# Patient Record
Sex: Male | Born: 2005 | Race: Black or African American | Hispanic: No | Marital: Single | Smoking: Never smoker
Health system: Southern US, Community
[De-identification: ages and names within clinical notes are randomized; demographics above are authoritative.]

## PROBLEM LIST (undated history)

## (undated) DIAGNOSIS — L309 Dermatitis, unspecified: Secondary | ICD-10-CM

## (undated) DIAGNOSIS — J45909 Unspecified asthma, uncomplicated: Secondary | ICD-10-CM

## (undated) DIAGNOSIS — T7840XA Allergy, unspecified, initial encounter: Secondary | ICD-10-CM

## (undated) HISTORY — DX: Unspecified asthma, uncomplicated: J45.909

## (undated) HISTORY — DX: Allergy, unspecified, initial encounter: T78.40XA

## (undated) HISTORY — DX: Dermatitis, unspecified: L30.9

---
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
## 2024-02-06 ENCOUNTER — Telehealth: Payer: Self-pay | Admitting: Internal Medicine

## 2024-02-06 NOTE — Telephone Encounter (Signed)
 Copied from CRM [id number]. Topic: General - Other >> Feb 06, 2024 12:48 PM Denese Killings wrote: Reason for CRM: Patient mom states that [facility] won't accept the mailed copy of immunization records since it doesn't have the doctor's stamp on it. She wants to know if a copy with doctor's stamp can be emailed or if she has to pick it up the copy with doctor's stamp on it.  Email: [email or web address]

## 2024-02-06 NOTE — Telephone Encounter (Signed)
 Mom picked up form.

## 2024-02-06 NOTE — Telephone Encounter (Signed)
 Spoke to pt's mom. [facility] copy with office stamp is ready. They will come pick it up.

## 2024-03-14 ENCOUNTER — Other Ambulatory Visit: Payer: Self-pay

## 2024-03-14 ENCOUNTER — Encounter: Payer: Self-pay | Admitting: Family Medicine

## 2024-03-14 ENCOUNTER — Ambulatory Visit: Payer: 59 | Admitting: Family Medicine

## 2024-03-14 VITALS — BP 100/64 | HR 78 | Temp 98.0°F | Ht 62.21 in | Wt 111.1 lb

## 2024-03-14 DIAGNOSIS — T7800XD Anaphylactic reaction due to unspecified food, subsequent encounter: Secondary | ICD-10-CM | POA: Diagnosis not present

## 2024-03-14 DIAGNOSIS — J3089 Other allergic rhinitis: Secondary | ICD-10-CM

## 2024-03-14 DIAGNOSIS — J302 Other seasonal allergic rhinitis: Secondary | ICD-10-CM

## 2024-03-14 DIAGNOSIS — J4520 Mild intermittent asthma, uncomplicated: Secondary | ICD-10-CM

## 2024-03-14 DIAGNOSIS — T7800XA Anaphylactic reaction due to unspecified food, initial encounter: Secondary | ICD-10-CM

## 2024-03-14 DIAGNOSIS — L2084 Intrinsic (allergic) eczema: Secondary | ICD-10-CM | POA: Diagnosis not present

## 2024-03-14 MED ORDER — EPINEPHRINE 0.3 MG/0.3ML IJ SOAJ: 0.3000 mg | INTRAMUSCULAR | 2 refills | Status: AC | PRN

## 2024-03-14 MED ORDER — MONTELUKAST SODIUM 10 MG PO TABS: 10.0000 mg | ORAL_TABLET | Freq: Every day | ORAL | 5 refills | Status: DC

## 2024-03-14 MED ORDER — ALBUTEROL SULFATE HFA 108 (90 BASE) MCG/ACT IN AERS: 2.0000 | INHALATION_SPRAY | RESPIRATORY_TRACT | 1 refills | Status: DC | PRN

## 2024-03-14 NOTE — Patient Instructions (Signed)
 Asthma Restart montelukast  10 mg once a day to prevent cough or wheeze Continue albuterol  2 puffs once every 4 hours as needed for cough or wheeze You may use albuterol  2 puffs 5-15 minutes before activity to decrease cough or wheeze  Allergic rhinitis Continue allergen avoidance directed toward grass pollen, weed pollen, tree pollen, mold and dust mite as listed below Restart montelukast  as listed above to help decrease allergy  symptoms You may take an over the counter antihistamine once a day if needed for a runny nose or itch Consider saline nasal rinses as needed for nasal symptoms. Use this before any medicated nasal sprays for best result  Atopic dermatitis Continue a twice a day moisturizing routine  Food allergy  Continue to avoid peanuts, tree nuts, coconut, sesame, and egg.  In case of an allergic reaction, give Benadryl  50 mg every 6 hours, and if life-threatening symptoms occur, inject with EpiPen  0.3 mg.  Call the clinic if this treatment plan is not working well for you  Follow up in 6 months or sooner if needed.  Reducing Pollen Exposure The American Academy of Allergy , Asthma and Immunology suggests the following steps to reduce your exposure to pollen during allergy  seasons. Do not hang sheets or clothing out to dry; pollen may collect on these items. Do not mow lawns or spend time around freshly cut grass; mowing stirs up pollen. Keep windows closed at night.  Keep car windows closed while driving. Minimize morning activities outdoors, a time when pollen counts are usually at their highest. Stay indoors as much as possible when pollen counts or humidity is high and on windy days when pollen tends to remain in the air longer. Use air conditioning when possible.  Many air conditioners have filters that trap the pollen spores. Use a HEPA room air filter to remove pollen form the indoor air you breathe.  Control of Mold Allergen Mold and fungi can grow on a variety of  surfaces provided certain temperature and moisture conditions exist.  Outdoor molds grow on plants, decaying vegetation and soil.  The major outdoor mold, Alternaria and Cladosporium, are found in very high numbers during hot and dry conditions.  Generally, a late Summer - Fall peak is seen for common outdoor fungal spores.  Rain will temporarily lower outdoor mold spore count, but counts rise rapidly when the rainy period ends.  The most important indoor molds are Aspergillus and Penicillium.  Dark, humid and poorly ventilated basements are ideal sites for mold growth.  The next most common sites of mold growth are the bathroom and the kitchen.  Outdoor [employer] Use air conditioning and keep windows closed Avoid exposure to decaying vegetation. Avoid leaf raking. Avoid grain handling. Consider wearing a face mask if working in moldy areas.  Indoor Mold Control Maintain humidity below 50%. Clean washable surfaces with 5% bleach solution. Remove sources e.g. Contaminated carpets.   Control of Dust Mite Allergen Dust mites play a major role in allergic asthma and rhinitis. They occur in environments with high humidity wherever human skin is found. Dust mites absorb humidity from the atmosphere (ie, they do not drink) and feed on organic matter (including shed human and animal skin). Dust mites are a microscopic type of insect that you cannot see with the naked eye. High levels of dust mites have been detected from mattresses, pillows, carpets, upholstered furniture, bed covers, clothes, soft toys and any woven material. The principal allergen of the dust mite is found in its feces. A  gram of dust may contain 1,000 mites and 250,000 fecal particles. Mite antigen is easily measured in the air during house cleaning activities. Dust mites do not bite and do not cause harm to humans, other than by triggering allergies/asthma.  Ways to decrease your exposure to dust mites in your home:  1. Encase  mattresses, box springs and pillows with a mite-impermeable barrier or cover  2. Wash sheets, blankets and drapes weekly in hot water (130 F) with detergent and dry them in a dryer on the hot setting.  3. Have the room cleaned frequently with a vacuum cleaner and a damp dust-mop. For carpeting or rugs, vacuuming with a vacuum cleaner equipped with a high-efficiency particulate air (HEPA) filter. The dust mite allergic individual should not be in a room which is being cleaned and should wait 1 hour after cleaning before going into the room.  4. Do not sleep on upholstered furniture (eg, couches).  5. If possible removing carpeting, upholstered furniture and drapery from the home is ideal. Horizontal blinds should be eliminated in the rooms where the person spends the most time (bedroom, study, television room). Washable vinyl, roller-type shades are optimal.  6. Remove all non-washable stuffed toys from the bedroom. Wash stuffed toys weekly like sheets and blankets above.  7. Reduce indoor humidity to less than 50%. Inexpensive humidity monitors can be purchased at most hardware stores. Do not use a humidifier as can make the problem worse and are not recommended.   Skin care recommendations   Bath time: Always use lukewarm water. AVOID very hot or cold water. Keep bathing time to 5-10 minutes. Do NOT use bubble bath. Use a mild soap and use just enough to wash the dirty areas. Do NOT scrub skin vigorously.  After bathing, pat dry your skin with a towel. Do NOT rub or scrub the skin.   Moisturizers and prescriptions:  ALWAYS apply moisturizers immediately after bathing (within 3 minutes). This helps to lock-in moisture. Use the moisturizer several times a day over the whole body. Good summer moisturizers include: Aveeno, CeraVe, Cetaphil. Good winter moisturizers include: Aquaphor, Vaseline, Cerave, Cetaphil, Eucerin, Vanicream. When using moisturizers along with medications, the  moisturizer should be applied about one hour after applying the medication to prevent diluting effect of the medication or moisturize around where you applied the medications. When not using medications, the moisturizer can be continued twice daily as maintenance.   Laundry and clothing: Avoid laundry products with added color or perfumes. Use unscented hypo-allergenic laundry products such as Tide free, Cheer free & gentle, and All free and clear.  If the skin still seems dry or sensitive, you can try double-rinsing the clothes. Avoid tight or scratchy clothing such as wool. Do not use fabric softeners or dyer sheets.

## 2024-03-14 NOTE — Progress Notes (Signed)
 [address] [place] [address] Dept: [phone]  FOLLOW UP NOTE  Patient ID: Justin Steele, male    DOB: 03/20/2006  Age: 19 y.o. MRN: [id number] Date of Office Visit: 03/14/2024  Assessment  Chief Complaint: Eczema, Asthma, Allergic Rhinitis , and Follow-up  HPI Justin Steele is an 18 year old male who presents to the clinic for follow-up visit.  He was last seen in this clinic on 09/14/2019 for by Marinus Sic, FNP, for evaluation of asthma, allergic rhinitis, atopic dermatitis, and food allergy  to peanuts, tree nuts, coconut, sesame, and egg.  He is accompanied by his mother who assists with history.  At today's visit, he reports that his asthma has been moderately well-controlled with occasional shortness of breath that occurs at random times.  He continues albuterol  about 1 to 2 days a week and is not currently taking montelukast .  He reports that he forgets to take montelukast .  Allergic rhinitis is reported as well-controlled with no nasal symptoms at this time including rhinorrhea, nasal congestion, sneezing, or postnasal drainage.  He is not currently using Flonase or nasal saline rinses.  He reports occasionally taking Allegra as needed with relief of symptoms. Chart review indicates that he began allergen immunotherapy directed toward weed pollen, dust mite, mold, grass pollen, and tree pollen in 2014.  His last allergen immunotherapy injection was on 09/14/2023.  Atopic dermatitis is reported as moderately well-controlled with only occasional itch.  He is not currently using a daily moisturizer and reports that atopic dermatitis is not bothersome at this time.  He continues to avoid peanuts, tree nuts, coconut, egg, and sesame with no accidental ingestion or EpiPen  use since his last visit.  His last food allergy  testing via lab was on 10/12/2022 and was positive to peanuts, tree nuts, coconut, sesame, and egg.  EpiPen  sent will be reordered at today's  visit.  His current medications are listed in the chart.   Drug Allergies:  Allergies  Allergen Reactions   Peanuts [Peanut  Oil] Anaphylaxis   Egg-Derived Products     Per allergy  testing- can eat egg derivatives     Physical Exam: BP 100/64 (BP Location: Left Arm, Patient Position: Sitting, Cuff Size: Normal)   Pulse 78   Temp 98 F (36.7 C) (Temporal)   Ht 5' 2.21" (1.58 m)   Wt 111 lb 1.6 oz (50.4 kg)   SpO2 97%   BMI 20.19 kg/m    Physical Exam Vitals reviewed.  Constitutional:      Appearance: Normal appearance.  HENT:     Head: Normocephalic and atraumatic.     Right Ear: Tympanic membrane normal.     Left Ear: Tympanic membrane normal.     Nose:     Comments: Bilateral nares slightly erythematous with thin clear nasal drainage noted.  Pharynx normal.  Ears normal.  Eyes normal.    Mouth/Throat:     Pharynx: Oropharynx is clear.  Eyes:     Conjunctiva/sclera: Conjunctivae normal.  Cardiovascular:     Rate and Rhythm: Normal rate and regular rhythm.     Heart sounds: Normal heart sounds. No murmur heard. Pulmonary:     Effort: Pulmonary effort is normal.     Breath sounds: Normal breath sounds.     Comments: Lungs clear to auscultation Musculoskeletal:        General: Normal range of motion.     Cervical back: Normal range of motion and neck supple.  Skin:    General: Skin is  warm and dry.  Neurological:     Mental Status: He is alert and oriented to person, place, and time.  Psychiatric:        Mood and Affect: Mood normal.        Behavior: Behavior normal.        Thought Content: Thought content normal.        Judgment: Judgment normal.     Diagnostics: FVC 3.10 which is 95% of predicted value, FEV1 2.52 which is 87% of predicted value.  Spirometry indicates normal ventilatory function.  Assessment and Plan: 1. Mild intermittent asthma without complication   2. Seasonal and perennial allergic rhinitis   3. Intrinsic atopic dermatitis   4.  Allergy  with anaphylaxis due to food     Meds ordered this encounter  Medications   albuterol  (VENTOLIN  HFA) 108 (90 Base) MCG/ACT inhaler    Sig: Inhale 2 puffs into the lungs every 4 (four) hours as needed for wheezing or shortness of breath.    Dispense:  6.7 each    Refill:  1   EPINEPHrine  (EPIPEN  2-PAK) 0.3 mg/0.3 mL IJ SOAJ injection    Sig: Inject 0.3 mg into the muscle as needed for anaphylaxis.    Dispense:  1 each    Refill:  2   montelukast  (SINGULAIR ) 10 MG tablet    Sig: Take 1 tablet (10 mg total) by mouth at bedtime.    Dispense:  30 tablet    Refill:  5    Patient Instructions  Asthma Restart montelukast  10 mg once a day to prevent cough or wheeze Continue albuterol  2 puffs once every 4 hours as needed for cough or wheeze You may use albuterol  2 puffs 5-15 minutes before activity to decrease cough or wheeze  Allergic rhinitis Continue allergen avoidance directed toward grass pollen, weed pollen, tree pollen, mold and dust mite as listed below Restart montelukast  as listed above to help decrease allergy  symptoms You may take an over the counter antihistamine once a day if needed for a runny nose or itch Consider saline nasal rinses as needed for nasal symptoms. Use this before any medicated nasal sprays for best result  Atopic dermatitis Continue a twice a day moisturizing routine  Food allergy  Continue to avoid peanuts, tree nuts, coconut, sesame, and egg.  In case of an allergic reaction, give Benadryl  50 mg every 6 hours, and if life-threatening symptoms occur, inject with EpiPen  0.3 mg.  Call the clinic if this treatment plan is not working well for you  Follow up in 6 months or sooner if needed.   Return in about 6 months (around 09/13/2024), or if symptoms worsen or fail to improve.    Thank you for the opportunity to care for this patient.  Please do not hesitate to contact me with questions.  Marinus Sic, FNP [facility]

## 2024-03-19 ENCOUNTER — Ambulatory Visit
Admission: EM | Admit: 2024-03-19 | Discharge: 2024-03-19 | Disposition: A | Discharge status: Home / Self Care | Attending: Family Medicine | Admitting: Family Medicine

## 2024-03-19 DIAGNOSIS — L299 Pruritus, unspecified: Secondary | ICD-10-CM | POA: Diagnosis not present

## 2024-03-19 DIAGNOSIS — R21 Rash and other nonspecific skin eruption: Secondary | ICD-10-CM

## 2024-03-19 MED ORDER — DOXYCYCLINE HYCLATE 100 MG PO CAPS: 100.0000 mg | ORAL_CAPSULE | Freq: Two times a day (BID) | ORAL | 0 refills | Status: AC

## 2024-03-19 MED ORDER — METHYLPREDNISOLONE 4 MG PO TBPK: ORAL_TABLET | ORAL | 0 refills | Status: DC

## 2024-03-19 NOTE — ED Triage Notes (Signed)
 Pt presents to [facility] itchy scalp since getting his hair braded last week. Pt is with mother who reports she took the braids out and washed it but he has still been co of extream itchiness.

## 2024-03-19 NOTE — ED Provider Notes (Signed)
 [facility]    CSN: [id number] Arrival date & time: 03/19/24  1509      History   Chief Complaint Chief Complaint  Patient presents with   scalp itchiness     HPI Justin Steele is a 18 y.o. male.   HPI 19 year old male presents with scalp itching for days.  PMH significant for eczema/atopic dermatitis.  Past Medical History:  Diagnosis Date   Allergy     allergic rhinitis   Asthma with allergic rhinitis    Eczema     Patient Active Problem List   Diagnosis Date Noted   Seasonal and perennial allergic rhinitis 06/09/2016   Food allergy  06/09/2016   Well adolescent visit without abnormal findings 02/17/2011   Mild intermittent asthma 01/16/2009   Atopic dermatitis 01/20/2007    History reviewed. No pertinent surgical history.     Home Medications    Prior to Admission medications   Medication Sig Start Date End Date Taking? Authorizing Provider  doxycycline (VIBRAMYCIN) 100 MG capsule Take 1 capsule (100 mg total) by mouth 2 (two) times daily for 7 days. 03/19/24 03/26/24 Yes Leonides Ramp, FNP  methylPREDNISolone (MEDROL DOSEPAK) 4 MG TBPK tablet Take as directed 03/19/24  Yes Leonides Ramp, FNP  albuterol  (VENTOLIN  HFA) 108 (90 Base) MCG/ACT inhaler Inhale 2 puffs into the lungs every 4 (four) hours as needed for wheezing or shortness of breath. 03/14/24   Ardie Kras, FNP  diphenhydrAMINE  (BENADRYL ) 12.5 MG/5ML elixir Take by mouth every 6 (six) hours as needed.    [provider]  EPINEPHrine  (EPIPEN  2-PAK) 0.3 mg/0.3 mL IJ SOAJ injection Inject 0.3 mg into the muscle as needed for anaphylaxis. 03/14/24   Ardie Kras, FNP  montelukast  (SINGULAIR ) 10 MG tablet Take 1 tablet (10 mg total) by mouth at bedtime. 03/14/24   Ambs, Jeanmarie Millet, FNP  NON FORMULARY once a week. Allergy  shots Patient not taking: Reported on 03/14/2024    [provider]    Family History Family History  Problem Relation Age of Onset   Allergies Mother     Hypertension Other        both sides   Allergic rhinitis Neg Hx    Angioedema Neg Hx    Asthma Neg Hx    Eczema Neg Hx    Immunodeficiency Neg Hx    Urticaria Neg Hx     Social History Social History   Tobacco Use   Smoking status: Never    Passive exposure: Current   Smokeless tobacco: Never  Vaping Use   Vaping status: Never Used  Substance Use Topics   Alcohol use: No   Drug use: No     Allergies   Peanuts [peanut  oil], Egg-derived products, Justicia adhatoda (malabar nut tree) [justicia adhatoda], Sesame seed (diagnostic), and Walnut   Review of Systems Review of Systems   Physical Exam Triage Vital Signs ED Triage Vitals  Encounter Vitals Group     BP      Systolic BP Percentile      Diastolic BP Percentile      Pulse      Resp      Temp      Temp src      SpO2      Weight      Height      Head Circumference      Peak Flow      Pain Score      Pain Loc  Pain Education      Exclude from Growth Chart    No data found.  Updated Vital Signs BP 118/68   Pulse 82   Temp 98.5 F (36.9 C) (Oral)   Resp 19   SpO2 98%   Visual Acuity Right Eye Distance:   Left Eye Distance:   Bilateral Distance:    Right Eye Near:   Left Eye Near:    Bilateral Near:     Physical Exam Vitals and nursing note reviewed.  Constitutional:      Appearance: Normal appearance. He is normal weight.  HENT:     Head: Normocephalic and atraumatic.     Mouth/Throat:     Mouth: Mucous membranes are moist.     Pharynx: Oropharynx is clear.  Eyes:     Extraocular Movements: Extraocular movements intact.     Conjunctiva/sclera: Conjunctivae normal.     Pupils: Pupils are equal, round, and reactive to light.  Cardiovascular:     Rate and Rhythm: Normal rate and regular rhythm.     Pulses: Normal pulses.     Heart sounds: Normal heart sounds.  Pulmonary:     Effort: Pulmonary effort is normal.     Breath sounds: Normal breath sounds. No wheezing, rhonchi or  rales.  Chest:     Chest wall: No tenderness.  Musculoskeletal:        General: Normal range of motion.     Cervical back: Normal range of motion and neck supple.  Skin:    General: Skin is warm and dry.  Neurological:     General: No focal deficit present.     Mental Status: He is alert and oriented to person, place, and time. Mental status is at baseline.  Psychiatric:        Mood and Affect: Mood normal.        Behavior: Behavior normal.      UC Treatments / Results  Labs (all labs ordered are listed, but only abnormal results are displayed) Labs Reviewed - No data to display  EKG   Radiology No results found.  Procedures Procedures (including critical care time)  Medications Ordered in UC Medications - No data to display  Initial Impression / Assessment and Plan / UC Course  I have reviewed the triage vital signs and the nursing notes.  Pertinent labs & imaging results that were available during my care of the patient were reviewed by me and considered in my medical decision making (see chart for details).     MDM: 1.  Itchy scalp-Rx'd Medrol Dosepak: Take as directed; 2.  Rash and nonspecific skin eruption-Rx'd doxycycline 100 mg capsule: Take 1 capsule twice daily. Final Clinical Impressions(s) / UC Diagnoses   Final diagnoses:  Rash and nonspecific skin eruption  Itchy scalp     Discharge Instructions      Advised patient to take medications as directed with food to completion.  Advised patient to take prednisone with first dose of doxycycline for 5 of 7 days.  Encouraged to increase daily water intake to 64 ounces per day while taking these medications.  Advised if symptoms worsen and/or unresolved please follow-up with your PCP or here for further evaluation.     ED Prescriptions     Medication Sig Dispense Auth. Provider   doxycycline (VIBRAMYCIN) 100 MG capsule Take 1 capsule (100 mg total) by mouth 2 (two) times daily for 7 days. 14 capsule  Jerold Yoss, FNP   methylPREDNISolone (MEDROL DOSEPAK) 4 MG  TBPK tablet Take as directed 1 each Leonides Ramp, FNP      PDMP not reviewed this encounter.   Leonides Ramp, FNP 03/19/24 1622

## 2024-03-19 NOTE — Discharge Instructions (Addendum)
 Advised patient to take medications as directed with food to completion.  Advised patient to take prednisone with first dose of doxycycline for 5 of 7 days.  Encouraged to increase daily water intake to 64 ounces per day while taking these medications.  Advised if symptoms worsen and/or unresolved please follow-up with your PCP or here for further evaluation.

## 2024-06-21 ENCOUNTER — Telehealth: Payer: Self-pay | Admitting: Internal Medicine

## 2024-06-21 NOTE — Telephone Encounter (Signed)
 Mom came in would like to speak to someone regarding the bill she just received regarding bill from  [facility] at  8:00 AM (15 min) Friday May 20, 2016 For a No Show.  States she does not owe this 50.00

## 2024-08-28 ENCOUNTER — Ambulatory Visit (INDEPENDENT_AMBULATORY_CARE_PROVIDER_SITE_OTHER)

## 2024-08-28 ENCOUNTER — Telehealth: Payer: Self-pay | Admitting: Internal Medicine

## 2024-08-28 DIAGNOSIS — Z23 Encounter for immunization: Secondary | ICD-10-CM | POA: Diagnosis not present

## 2024-08-28 NOTE — Telephone Encounter (Signed)
 Pt mother came in asking about a bill pt got 5 or 6 years ago and would like for Amy to look into it for a no show

## 2024-08-28 NOTE — Progress Notes (Signed)
 Per orders of Dr. Jacques Copland, injection of Egg Free flu vaccine given by Bobbette Sprague in left deltoid. Patient tolerated injection well.

## 2024-09-12 ENCOUNTER — Ambulatory Visit: Admitting: Family Medicine

## 2024-11-03 NOTE — Progress Notes (Unsigned)
° °  [address] [place] [address] Dept: [phone]  FOLLOW UP NOTE  Patient ID: Justin Steele, male    DOB: 2006/01/09  Age: 18 y.o. MRN: [id number] Date of Office Visit: 11/04/2024  Assessment  Chief Complaint: No chief complaint on file.  HPI Justin Steele is a 18 year old male who presents to the clinic for follow-up visit.  He was last seen in this clinic on 03/14/2024 by Arlean Mutter, FNP, for evaluation of asthma, allergic rhinitis, atopic dermatitis, and food allergy  to peanuts, tree nuts, coconut, sesame, and egg.  Chart review indicates that he began allergen immunotherapy directed toward weed pollen, dust mite, mold, grass pollen, and tree pollen in 2014 with his last injection on 09/14/2023.  Food allergy  lab testing was positive to peanuts, tree nuts, coconut, sesame, and egg on 10/12/2022.  Discussed the use of AI scribe software for clinical note transcription with the patient, who gave verbal consent to proceed.  History of Present Illness      Drug Allergies:  Allergies[1]  Physical Exam: There were no vitals taken for this visit.   Physical Exam  Diagnostics:    Assessment and Plan: No diagnosis found.  No orders of the defined types were placed in this encounter.   There are no Patient Instructions on file for this visit.  No follow-ups on file.    Thank you for the opportunity to care for this patient.  Please do not hesitate to contact me with questions.  Arlean Mutter, FNP [facility]          [1]  Allergies Allergen Reactions   Peanuts [Peanut  Oil] Anaphylaxis   Egg Protein-Containing Drug Products     Per allergy  testing- can eat egg derivatives    Justicia Adhatoda (Malabar Nut Tree) [Justicia Adhatoda]    Sesame Seed (Diagnostic)    [facility]

## 2024-11-03 NOTE — Patient Instructions (Incomplete)
 Asthma Restart montelukast  10 mg once a day to prevent cough or wheeze Continue albuterol  2 puffs once every 4 hours as needed for cough or wheeze You may use albuterol  2 puffs 5-15 minutes before activity to decrease cough or wheeze  Allergic rhinitis Continue allergen avoidance directed toward grass pollen, weed pollen, tree pollen, mold and dust mite as listed below Restart montelukast  as listed above to help decrease allergy  symptoms You may take an over the counter antihistamine once a day if needed for a runny nose or itch Consider saline nasal rinses as needed for nasal symptoms. Use this before any medicated nasal sprays for best result  Atopic dermatitis Continue a twice a day moisturizing routine  Food allergy  Continue to avoid peanuts, tree nuts, coconut, sesame, and egg.  In case of an allergic reaction, give Benadryl  50 mg every 6 hours, and if life-threatening symptoms occur, inject with EpiPen  0.3 mg. Consider Xolair for protection against accidental ingestion for food allergy .  Call the clinic if you are interested in this option Consider oral immunotherapy directed toward peanuts, tree nuts, coconut, sesame, or egg.  Call the clinic if you are interested in this option  Call the clinic if this treatment plan is not working well for you  Follow up in 6 months or sooner if needed.  Reducing Pollen Exposure The American Academy of Allergy , Asthma and Immunology suggests the following steps to reduce your exposure to pollen during allergy  seasons. Do not hang sheets or clothing out to dry; pollen may collect on these items. Do not mow lawns or spend time around freshly cut grass; mowing stirs up pollen. Keep windows closed at night.  Keep car windows closed while driving. Minimize morning activities outdoors, a time when pollen counts are usually at their highest. Stay indoors as much as possible when pollen counts or humidity is high and on windy days when pollen tends to  remain in the air longer. Use air conditioning when possible.  Many air conditioners have filters that trap the pollen spores. Use a HEPA room air filter to remove pollen form the indoor air you breathe.  Control of Mold Allergen Mold and fungi can grow on a variety of surfaces provided certain temperature and moisture conditions exist.  Outdoor molds grow on plants, decaying vegetation and soil.  The major outdoor mold, Alternaria and Cladosporium, are found in very high numbers during hot and dry conditions.  Generally, a late Summer - Fall peak is seen for common outdoor fungal spores.  Rain will temporarily lower outdoor mold spore count, but counts rise rapidly when the rainy period ends.  The most important indoor molds are Aspergillus and Penicillium.  Dark, humid and poorly ventilated basements are ideal sites for mold growth.  The next most common sites of mold growth are the bathroom and the kitchen.  Outdoor [employer] Use air conditioning and keep windows closed Avoid exposure to decaying vegetation. Avoid leaf raking. Avoid grain handling. Consider wearing a face mask if working in moldy areas.  Indoor Mold Control Maintain humidity below 50%. Clean washable surfaces with 5% bleach solution. Remove sources e.g. Contaminated carpets.   Control of Dust Mite Allergen Dust mites play a major role in allergic asthma and rhinitis. They occur in environments with high humidity wherever human skin is found. Dust mites absorb humidity from the atmosphere (ie, they do not drink) and feed on organic matter (including shed human and animal skin). Dust mites are a microscopic type of insect  that you cannot see with the naked eye. High levels of dust mites have been detected from mattresses, pillows, carpets, upholstered furniture, bed covers, clothes, soft toys and any woven material. The principal allergen of the dust mite is found in its feces. A gram of dust may contain 1,000 mites and  250,000 fecal particles. Mite antigen is easily measured in the air during house cleaning activities. Dust mites do not bite and do not cause harm to humans, other than by triggering allergies/asthma.  Ways to decrease your exposure to dust mites in your home:  1. Encase mattresses, box springs and pillows with a mite-impermeable barrier or cover  2. Wash sheets, blankets and drapes weekly in hot water (130 F) with detergent and dry them in a dryer on the hot setting.  3. Have the room cleaned frequently with a vacuum cleaner and a damp dust-mop. For carpeting or rugs, vacuuming with a vacuum cleaner equipped with a high-efficiency particulate air (HEPA) filter. The dust mite allergic individual should not be in a room which is being cleaned and should wait 1 hour after cleaning before going into the room.  4. Do not sleep on upholstered furniture (eg, couches).  5. If possible removing carpeting, upholstered furniture and drapery from the home is ideal. Horizontal blinds should be eliminated in the rooms where the person spends the most time (bedroom, study, television room). Washable vinyl, roller-type shades are optimal.  6. Remove all non-washable stuffed toys from the bedroom. Wash stuffed toys weekly like sheets and blankets above.  7. Reduce indoor humidity to less than 50%. Inexpensive humidity monitors can be purchased at most hardware stores. Do not use a humidifier as can make the problem worse and are not recommended.   Skin care recommendations   Bath time: Always use lukewarm water. AVOID very hot or cold water. Keep bathing time to 5-10 minutes. Do NOT use bubble bath. Use a mild soap and use just enough to wash the dirty areas. Do NOT scrub skin vigorously.  After bathing, pat dry your skin with a towel. Do NOT rub or scrub the skin.   Moisturizers and prescriptions:  ALWAYS apply moisturizers immediately after bathing (within 3 minutes). This helps to lock-in  moisture. Use the moisturizer several times a day over the whole body. Good summer moisturizers include: Aveeno, CeraVe, Cetaphil. Good winter moisturizers include: Aquaphor, Vaseline, Cerave, Cetaphil, Eucerin, Vanicream. When using moisturizers along with medications, the moisturizer should be applied about one hour after applying the medication to prevent diluting effect of the medication or moisturize around where you applied the medications. When not using medications, the moisturizer can be continued twice daily as maintenance.   Laundry and clothing: Avoid laundry products with added color or perfumes. Use unscented hypo-allergenic laundry products such as Tide free, Cheer free & gentle, and All free and clear.  If the skin still seems dry or sensitive, you can try double-rinsing the clothes. Avoid tight or scratchy clothing such as wool. Do not use fabric softeners or dyer sheets.

## 2024-11-04 ENCOUNTER — Ambulatory Visit

## 2024-11-04 ENCOUNTER — Other Ambulatory Visit: Payer: Self-pay

## 2024-11-04 ENCOUNTER — Ambulatory Visit: Admitting: Family Medicine

## 2024-11-04 ENCOUNTER — Encounter: Payer: Self-pay | Admitting: Family Medicine

## 2024-11-04 VITALS — BP 98/62 | HR 77 | Temp 97.0°F

## 2024-11-04 VITALS — BP 116/66 | HR 75 | Temp 98.1°F | Ht 62.5 in | Wt 109.1 lb

## 2024-11-04 DIAGNOSIS — J4520 Mild intermittent asthma, uncomplicated: Secondary | ICD-10-CM | POA: Diagnosis not present

## 2024-11-04 DIAGNOSIS — L2084 Intrinsic (allergic) eczema: Secondary | ICD-10-CM

## 2024-11-04 DIAGNOSIS — J302 Other seasonal allergic rhinitis: Secondary | ICD-10-CM | POA: Diagnosis not present

## 2024-11-04 DIAGNOSIS — J3089 Other allergic rhinitis: Secondary | ICD-10-CM | POA: Diagnosis not present

## 2024-11-04 DIAGNOSIS — Z0000 Encounter for general adult medical examination without abnormal findings: Secondary | ICD-10-CM | POA: Diagnosis not present

## 2024-11-04 DIAGNOSIS — T7800XA Anaphylactic reaction due to unspecified food, initial encounter: Secondary | ICD-10-CM

## 2024-11-04 DIAGNOSIS — T7800XD Anaphylactic reaction due to unspecified food, subsequent encounter: Secondary | ICD-10-CM

## 2024-11-04 MED ORDER — ALBUTEROL SULFATE HFA 108 (90 BASE) MCG/ACT IN AERS: 2.0000 | INHALATION_SPRAY | RESPIRATORY_TRACT | 1 refills | Status: AC | PRN

## 2024-11-04 MED ORDER — MONTELUKAST SODIUM 10 MG PO TABS: 10.0000 mg | ORAL_TABLET | Freq: Every day | ORAL | 5 refills | Status: AC

## 2024-11-04 NOTE — Progress Notes (Signed)
 Subjective:   This visit was conducted in person. The parent gave informed consent to the use of [employer] AI technology to record the contents of the encounter as documented below.   Patient ID: Justin Steele, male    DOB: [birth year], 18 y.o.   MRN: [id number]   Justin Steele is a very pleasant 18 y.o. male who presents today for a physical.  Discussed the use of AI scribe software for clinical note transcription with the patient, who gave verbal consent to proceed.  History of Present Illness Justin Steele is a 18 year old male who presents for an annual physical exam. He is accompanied by his mother.  He has a history of asthma, allergies, and eczema. For asthma and allergies, he takes Singulair  10 mg daily at bedtime and uses an albuterol  inhaler as needed, approximately twice a week, depending on his activities. He carries an EpiPen  and uses Benadryl  every six hours as needed for allergic reactions. His asthma is triggered by colds and physical exertion, such as rushing to class.  He has a history of receiving allergy  shots during childhood, which were discontinued to assess his current status. His allergies include peanuts, some egg derivatives, walnuts, sesame seeds, and blueberries. He can consume egg derivatives but avoids 'wet eggs'. He received an egg-free flu shot this year.  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 He has had braces and currently wears a retainer.  He has an eye condition that required patching in childhood due to a focusing issue. He wears glasses and follows up with an eye doctor regularly.  No mood issues, anxiety, or depression. He is not sexually active and denies any substance use, including smoking, vaping, marijuana, and alcohol. He feels safe at home with his mother.  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 He enjoys video games, drawing, and watching movies. He eats a balanced diet with fruits and vegetables, though he consumes sodas and processed foods in moderation. He drinks a lot of water and engages in physical activity primarily through walking.  He experiences bowel movements every two to three days.  Diet: Sometimes eats fast food, drinking lots of water  Exercise: Not getting  Home/environment: Feeling safe  Education/academics: [employer], Theatre/television/film  Activities/interests/friends: Video games, drawing   Drugs/EtOH/tobacco: No concerns  Sexuality/dating/need for STD testing: No concerns  Suicide/depression/anxiety: No concerns  Dentist/brushing teeth/flossing: UTD  Eye exam: UTD  Immunizations: Declines COVID  Driving/Seat belt use discussed Sunscreen use discussed. No changing moles on skin.    Review of Systems  All other systems reviewed and are negative.   BP 116/66   Pulse 75   Temp 98.1 F (36.7 C) (Oral)   Ht 5' 2.5 (1.588 m)   Wt 109 lb 2 oz (49.5 kg)   SpO2 98%   BMI 19.64 kg/m   Objective:     Physical Exam GENERAL: Alert, cooperative, well developed, no acute distress. HEAD: Normocephalic atraumatic. EYES: Extraocular movements intact bilaterally, pupils round, equal and reactive to light bilaterally, conjunctivae normal bilaterally, vision grossly intact. EARS: Tympanic membrane, ear canal and external ear normal bilaterally, minimal cerumen. NOSE: No congestion or rhinorrhea, mucous membranes are moist. THROAT: No oropharyngeal exudate or posterior oropharyngeal erythema, pharynx normal. CARDIOVASCULAR: Normal heart rate and rhythm, S1 and S2 normal without murmurs. CHEST: Clear to auscultation bilaterally, no wheezes,  rhonchi, or crackles. ABDOMEN: Soft, non-tender, non-distended, without organomegaly, normal bowel sounds. EXTREMITIES: No cyanosis or edema. NEUROLOGICAL: Oriented to person, place and  time, no gait abnormalities, moves all extremities without gross motor or sensory deficit. NECK: No thyromegaly.        Assessment & Plan:    Assessment & Plan General Health Maintenance 18 y.o. male who presents today for a physical. Growth and development appropriate for age. Age appropriate screening, counseling. Anticipatory guidance discussed with patient and parent as below. Immunizations up to date. Discussed sunscreen use and soda moderation.  - Ensure immunizations remain up to date. - Use sunscreen during outdoor activities. - Moderate soda consumption and consider sugar-free options.   Return in about 1 year (around 11/04/2025) for CPE.   Kadeem Hyle K Areli Frary, MD  11/04/2024     Contains text generated by [employer] software

## 2024-11-04 NOTE — Patient Instructions (Signed)
 Thank you for visiting  Healthcare today! Here's what we talked about: - Increase fiber intake: green veggies or chia seeds to improve bowel movements - Start exercising

## 2025-05-08 ENCOUNTER — Ambulatory Visit: Admitting: Allergy

## 2025-11-05 ENCOUNTER — Encounter
